# Patient Record
Sex: Male | Born: 1980 | Race: White | Hispanic: No | Marital: Married | Smoking: Never smoker
Health system: Southern US, Community
[De-identification: ages and names within clinical notes are randomized; demographics above are authoritative.]

## PROBLEM LIST (undated history)

## (undated) HISTORY — PX: INGUINAL HERNIA REPAIR: SUR1180

---

## 2014-11-14 ENCOUNTER — Ambulatory Visit (HOSPITAL_COMMUNITY)
Admission: RE | Admit: 2014-11-14 | Discharge: 2014-11-14 | Disposition: A | Discharge status: Home / Self Care | Payer: BLUE CROSS/BLUE SHIELD | Source: Ambulatory Visit | Attending: Family Medicine | Admitting: Family Medicine

## 2014-11-14 ENCOUNTER — Emergency Department (HOSPITAL_COMMUNITY)
Admission: EM | Admit: 2014-11-14 | Discharge: 2014-11-14 | Disposition: A | Discharge status: Home / Self Care | Payer: BLUE CROSS/BLUE SHIELD | Attending: Emergency Medicine | Admitting: Emergency Medicine

## 2014-11-14 ENCOUNTER — Encounter (HOSPITAL_COMMUNITY): Payer: Self-pay

## 2014-11-14 ENCOUNTER — Other Ambulatory Visit (HOSPITAL_COMMUNITY): Payer: Self-pay | Admitting: Family Medicine

## 2014-11-14 DIAGNOSIS — R1031 Right lower quadrant pain: Secondary | ICD-10-CM

## 2014-11-14 DIAGNOSIS — Z79899 Other long term (current) drug therapy: Secondary | ICD-10-CM | POA: Insufficient documentation

## 2014-11-14 DIAGNOSIS — R1011 Right upper quadrant pain: Secondary | ICD-10-CM

## 2014-11-14 DIAGNOSIS — R109 Unspecified abdominal pain: Secondary | ICD-10-CM | POA: Diagnosis present

## 2014-11-14 LAB — CBC WITH DIFFERENTIAL/PLATELET
Basophils Absolute: 0 10*3/uL (ref 0.0–0.1)
Basophils Relative: 0 % (ref 0–1)
Eosinophils Absolute: 0.1 10*3/uL (ref 0.0–0.7)
Eosinophils Relative: 1 % (ref 0–5)
HCT: 40.3 % (ref 39.0–52.0)
Hemoglobin: 14 g/dL (ref 13.0–17.0)
Lymphocytes Relative: 18 % (ref 12–46)
Lymphs Abs: 1.8 10*3/uL (ref 0.7–4.0)
MCH: 30.1 pg (ref 26.0–34.0)
MCHC: 34.7 g/dL (ref 30.0–36.0)
MCV: 86.7 fL (ref 78.0–100.0)
Monocytes Absolute: 0.7 10*3/uL (ref 0.1–1.0)
Monocytes Relative: 7 % (ref 3–12)
Neutro Abs: 7.2 10*3/uL (ref 1.7–7.7)
Neutrophils Relative %: 74 % (ref 43–77)
Platelets: 268 10*3/uL (ref 150–400)
RBC: 4.65 MIL/uL (ref 4.22–5.81)
RDW: 12.1 % (ref 11.5–15.5)
WBC: 9.8 10*3/uL (ref 4.0–10.5)

## 2014-11-14 LAB — BASIC METABOLIC PANEL
Anion gap: 9 (ref 5–15)
BUN: 15 mg/dL (ref 6–23)
CO2: 24 mmol/L (ref 19–32)
Calcium: 9.1 mg/dL (ref 8.4–10.5)
Chloride: 102 mmol/L (ref 96–112)
Creatinine, Ser: 0.97 mg/dL (ref 0.50–1.35)
GFR calc Af Amer: >90 mL/min (ref >90)
GFR calc non Af Amer: >90 mL/min (ref >90)
Glucose, Bld: 94 mg/dL (ref 70–99)
Potassium: 3.5 mmol/L (ref 3.5–5.1)
Sodium: 135 mmol/L (ref 135–145)

## 2014-11-14 MED ORDER — IOHEXOL 300 MG/ML  SOLN
50.0000 mL | Freq: Once | INTRAMUSCULAR | Status: AC | PRN
  Administered 2014-11-14: 50 mL via ORAL

## 2014-11-14 MED ORDER — IOHEXOL 300 MG/ML  SOLN
100.0000 mL | Freq: Once | INTRAMUSCULAR | Status: AC | PRN
  Administered 2014-11-14: 100 mL via INTRAVENOUS

## 2014-11-14 MED ORDER — OXYCODONE-ACETAMINOPHEN 5-325 MG PO TABS: 1.0000 | ORAL_TABLET | Freq: Four times a day (QID) | ORAL | Status: AC | PRN

## 2014-11-14 NOTE — ED Notes (Signed)
Nurse currently drawing labs 

## 2014-11-14 NOTE — ED Provider Notes (Signed)
CSN: [id number]     Arrival date & time 11/14/14  1920 History   First MD Initiated Contact with Patient 11/14/14 1925     Chief Complaint  Patient presents with  . Abdominal Pain     (Consider location/radiation/quality/duration/timing/severity/associated sxs/prior Treatment) HPI  This is a 34 yo male who presents with abdominal pain. Onset of abdominal pain yesterday. Pain initially was periumbilical, constant, and nonradiating. Pain has since moved to the right lower quadrant. Patient reports that in general it is a dull pain area currently rates his pain at 1 out of 10. He states that it is okay if he stays still. Denies any nausea, vomiting, diarrhea, or associated symptoms. Patient was seen by his primary physician and sent for CT scan. CT scan suspicious for early appendicitis. Patient has not had anything to eat today.  History reviewed. No pertinent past medical history. Past Surgical History  Procedure Laterality Date  . Inguinal hernia repair Left    No family history on file. History  Substance Use Topics  . Smoking status: Never Smoker   . Smokeless tobacco: Not on file  . Alcohol Use: No    Review of Systems  Constitutional: Negative.  Negative for fever.  Respiratory: Negative.  Negative for chest tightness and shortness of breath.   Cardiovascular: Negative.  Negative for chest pain.  Gastrointestinal: Positive for abdominal pain. Negative for nausea, vomiting and diarrhea.  Genitourinary: Negative.  Negative for dysuria.  Musculoskeletal: Negative for back pain.  Skin: Negative for rash.  Neurological: Negative for headaches.  All other systems reviewed and are negative.     Allergies  Shellfish allergy  Home Medications   Prior to Admission medications   Medication Sig Start Date End Date Taking? Authorizing Provider  citalopram (CELEXA) 40 MG tablet Take 40 mg by mouth daily. 10/08/14  Yes Historical Provider, MD  ibuprofen (ADVIL,MOTRIN) 200 MG  tablet Take 800 mg by mouth every 6 (six) hours as needed for mild pain or moderate pain.   Yes Historical Provider, MD  oxyCODONE-acetaminophen (PERCOCET/ROXICET) 5-325 MG per tablet Take 1 tablet by mouth every 6 (six) hours as needed for severe pain. 11/14/14   Shon Batonourtney F Jaiven Graveline, MD   BP 150/95 mmHg  Pulse 83  Temp(Src) 98.8 F (37.1 C) (Oral)  Resp 18  SpO2 97% Physical Exam  Constitutional: He is oriented to person, place, and time. He appears well-developed and well-nourished. No distress.  HENT:  Head: Normocephalic and atraumatic.  Cardiovascular: Normal rate, regular rhythm and normal heart sounds.   No murmur heard. Pulmonary/Chest: Effort normal and breath sounds normal. No respiratory distress. He has no wheezes.  Abdominal: Soft. Bowel sounds are normal. There is tenderness. There is no rebound.  Right lower quadrant tenderness to palpation, no rebound or guarding, negative Rovsing's, no signs of peritonitis  Musculoskeletal: He exhibits no edema.  Neurological: He is alert and oriented to person, place, and time.  Skin: Skin is warm and dry.  Psychiatric: He has a normal mood and affect.  Nursing note and vitals reviewed.   ED Course  Procedures (including critical care time) Labs Review Labs Reviewed  CBC WITH DIFFERENTIAL/PLATELET  BASIC METABOLIC PANEL    Imaging Review Ct Abdomen Pelvis W Contrast  11/14/2014   ADDENDUM REPORT: 11/14/2014 19:16  ADDENDUM: We were unable to reach the covering service in a timely manner. The patient was [facility] for further evaluation.   Electronically Signed  By: Carey Bullocks M.D.   On: 11/14/2014 19:16   11/14/2014   CLINICAL DATA:  Right lower quadrant abdominal pain for 1 day. No previous relevant surgery. Initial encounter.  EXAM: CT ABDOMEN AND PELVIS WITH CONTRAST  TECHNIQUE: Multidetector CT imaging of the abdomen and pelvis was performed using the standard protocol  following bolus administration of intravenous contrast.  CONTRAST:  50mL OMNIPAQUE IOHEXOL 300 MG/ML SOLN, OMNIPAQUE IOHEXOL 300 MG/ML SOLN  COMPARISON:  None.  FINDINGS: Lower chest: Clear lung bases. There is no pleural or pericardial effusion. Small hiatal hernia noted.  Hepatobiliary: The liver is normal in density without focal abnormality. No evidence of gallstones, gallbladder wall thickening or biliary dilatation.  Pancreas: Unremarkable. No pancreatic ductal dilatation or surrounding inflammatory changes.  Spleen: Normal in size without focal abnormality.  Adrenals/Urinary Tract: Both adrenal glands appear normal.The kidneys appear normal without evidence of urinary tract calculus, suspicious lesion or hydronephrosis. No bladder abnormalities are seen.  Stomach/Bowel: The appendix is mildly distended to 10 mm. There is minimal surrounding soft tissue stranding suspicious for inflammation. There is no extraluminal fluid collection. The stomach, small bowel and colon appear normal without wall thickening or distention.  Vascular/Lymphatic: There are no enlarged abdominal or pelvic lymph nodes. No significant vascular findings are present.  Reproductive: Unremarkable.  Other: Tiny umbilical hernia containing only fat.  Musculoskeletal: No acute or significant osseous findings.  IMPRESSION: 1. Findings are suspicious for acute appendicitis. No evidence of perforation, abscess or bowel obstruction. 2. No other significant findings. 3. The physician covering for Dr. Tiburcio Pea has been paged at 1835 hours.  Electronically Signed: By: Carey Bullocks M.D. On: 11/14/2014 18:36     EKG Interpretation None      MDM   Final diagnoses:  RLQ abdominal pain    Patient presents with history suggestive of possible appendicitis. CT scan done as an outpatient suggestive of early appendicitis. Patient is comfortable on exam and with minimal pain. No signs of peritonitis.  Discussed with Dr. Michaell Cowing who will  evaluate.  Dr. Michaell Cowing evaluated the patient. Feels patient's exam is very benign and CT findings are very subtle. Patient shows no signs of peritonitis. Dr. Michaell Cowing would like patient to be by mouth challenge. If he tolerates by mouth and does not have any increased pain, he can be discharged home. Patient is okay with this plan. Patient tolerated crackers and fluids.  He had no increase in pain following eating. Patient was given strict return precautions including increasing pain, fever, Nausea, vomiting.  After history, exam, and medical workup I feel the patient has been appropriately medically screened and is safe for discharge home. Pertinent diagnoses were discussed with the patient. Patient was given return precautions.     Shon Baton, MD 11/14/14 2109

## 2014-11-14 NOTE — ED Notes (Signed)
Pt presents in NAD. Pt c/o of stomach pain RLQ at present. CT scan confirms appendicitis.

## 2014-11-14 NOTE — ED Notes (Signed)
Patients PCP (dr. Azucena Kubaeid) on call, called to check in on the patient.

## 2014-11-14 NOTE — Consult Note (Signed)
[facility]  [address]  [place], [place] [address] Phone: [phone] FAX: [phone]     Justin Mcmahon  Nov 22, 1980 [id number]  CARE TEAM:  PCP: Johny Blamer, MD  Outpatient Care Team: Patient Care Team: Johny Blamer, MD as PCP - General (Family Medicine)  Inpatient Treatment Team: Treatment Team: Attending Provider: Shon Baton, MD; Registered Nurse: Federico Flake, RN; Registered Nurse: Herschell Dimes, RN; Consulting Physician: Bishop Limbo, MD  This patient is a 34 y.o.male who presents today for surgical evaluation at the request of Dr Saundra Shelling ED.   Reason for evaluation: Abdominal pain.  Rule out appendicitis.  Pleasant overweight male.  He woke up yesterday morning with sharp upper abdominal pain.  Rather severe.  Right Gas-X.  It did not help.  He does have a history of heartburn and reflux.  He is usually mild and usually controlled with over-the-counter medications.  Sometimes spicy foods bother him which she tends to void.  This pain was not like that.  No problems with greasy foods.  His pain was very sharp and intense.  It concerned him.  He went to his primary care office today.  I actually was feeling better.  Pain seemed to be a little more the right side.  Based on concerns, primary care office ordered CAT scan.  CAT scan done.  Not able to reach primary care office.  There was concern about possible appendicitis.  Sent to the emergency room.  Evaluation made.  While examination underwhelming, surgical consultation requested.  Patient notes the pain is very mild right now.  He is not taken pain medications.  He is not on any antibiotics.  He has not received any other medication.   He had no problems with coughing.  No problem with the ride here.  No nausea or vomiting.  He has not eaten today as instructed.  He is hungry.  No personal nor family history of GI/colon cancer, inflammatory bowel disease,  irritable bowel syndrome, allergy such as Celiac Sprue, dietary/dairy problems, colitis, ulcers nor gastritis.  No recent sick contacts/gastroenteritis.  He has two children at home with mild low-grade fevers but no nausea or vomiting.  No colds or flus.  No travel outside the country.  No changes in diet.  No dysphagia to solids or liquids.  No hematochezia, hematemesis, coffee ground emesis.  No evidence of prior gastric/peptic ulceration.  Not immunosuppressed.  Not diabetic.  He does not smoke.  Not on steroids.  Rather physically active.  No history of cardiac issues.  No recent cold caustic fluids.  No jaundice.  No history of hepatitis or pancreatitis.    History reviewed. No pertinent past medical history.  Past Surgical History  Procedure Laterality Date  . Hernia repair      History   Social History  . Marital Status: Married    Spouse Name: N/A  . Number of Children: N/A  . Years of Education: N/A   Occupational History  . Not on file.   Social History Main Topics  . Smoking status: Never Smoker   . Smokeless tobacco: Not on file  . Alcohol Use: No  . Drug Use: Not on file  . Sexual Activity: Not on file   Other Topics Concern  . Not on file   Social History Narrative    No family history on file.  No current facility-administered medications for this encounter.   No current outpatient prescriptions  on file.     Allergies  Allergen Reactions  . Shellfish Allergy Hives    ROS: Constitutional:  No fevers, chills, sweats.  Weight stable Eyes:  No vision changes, No discharge HENT:  No sore throats, nasal drainage Lymph: No neck swelling, No bruising easily Pulmonary:  No cough, productive sputum CV: No orthopnea, PND  Patient walks 120 minutes for about 3 miles without difficulty.  No exertional chest/neck/shoulder/arm pain. GI:  No personal nor family history of GI/colon cancer, inflammatory bowel disease, irritable bowel syndrome, allergy such as Celiac  Sprue, dietary/dairy problems, colitis, ulcers nor gastritis.  No recent sick contacts/gastroenteritis.  No travel outside the country.  No changes in diet. Renal: No UTIs, No hematuria Genital:  No drainage, bleeding, masses Musculoskeletal: No severe joint pain.  Good ROM major joints Skin:  No sores or lesions.  No rashes Heme/Lymph:  No easy bleeding.  No swollen lymph nodes Neuro: No focal weakness/numbness.  No seizures Psych: No suicidal ideation.  No hallucinations  BP 150/95 mmHg  Pulse 83  Temp(Src) 98.8 F (37.1 C) (Oral)  Resp 18  SpO2 97%  Physical Exam: General: Pt awake/alert/oriented x4 in no major acute distress Eyes: PERRL, normal EOM. Sclera nonicteric Neuro: CN II-XII intact w/o focal sensory/motor deficits. Lymph: No head/neck/groin lymphadenopathy Psych:  No delerium/psychosis/paranoia HENT: Normocephalic, Mucus membranes moist.  No thrush Neck: Supple, No tracheal deviation Chest: No pain.  Good respiratory excursion. CV:  Pulses intact.  Regular rhythm Abdomen: Soft, Nondistended.  Minimally tender RUQ medial to deep palpation.  No pain with cough or bed shake.  No pain with percussion.  No rebound tenderness.  No psoas sign.  Small incarcerated umbilical hernia.  No inflammation. Genital: Normal external male genitalia.  No inguinal hernia. Ext:  SCDs BLE.  No significant edema.  No cyanosis Skin: No petechiae / purpurea.  No major sores Musculoskeletal: No severe joint pain.  Good ROM major joints   Results:   Labs: No results found for this or any previous visit (from the past 48 hour(s)).  Imaging / Studies: Ct Abdomen Pelvis W Contrast  11/14/2014   ADDENDUM REPORT: 11/14/2014 19:16  ADDENDUM: We were unable to reach the covering service in a timely manner. The patient was taken to the emergency department at [facility] for further evaluation.   Electronically Signed   By: Carey Bullocks M.D.   On: 11/14/2014 19:16   11/14/2014    CLINICAL DATA:  Right lower quadrant abdominal pain for 1 day. No previous relevant surgery. Initial encounter.  EXAM: CT ABDOMEN AND PELVIS WITH CONTRAST  TECHNIQUE: Multidetector CT imaging of the abdomen and pelvis was performed using the standard protocol following bolus administration of intravenous contrast.  CONTRAST:  50mL OMNIPAQUE IOHEXOL 300 MG/ML SOLN, OMNIPAQUE IOHEXOL 300 MG/ML SOLN  COMPARISON:  None.  FINDINGS: Lower chest: Clear lung bases. There is no pleural or pericardial effusion. Small hiatal hernia noted.  Hepatobiliary: The liver is normal in density without focal abnormality. No evidence of gallstones, gallbladder wall thickening or biliary dilatation.  Pancreas: Unremarkable. No pancreatic ductal dilatation or surrounding inflammatory changes.  Spleen: Normal in size without focal abnormality.  Adrenals/Urinary Tract: Both adrenal glands appear normal.The kidneys appear normal without evidence of urinary tract calculus, suspicious lesion or hydronephrosis. No bladder abnormalities are seen.  Stomach/Bowel: The appendix is mildly distended to 10 mm. There is minimal surrounding soft tissue stranding suspicious for inflammation. There is no extraluminal fluid collection. The stomach, small  bowel and colon appear normal without wall thickening or distention.  Vascular/Lymphatic: There are no enlarged abdominal or pelvic lymph nodes. No significant vascular findings are present.  Reproductive: Unremarkable.  Other: Tiny umbilical hernia containing only fat.  Musculoskeletal: No acute or significant osseous findings.  IMPRESSION: 1. Findings are suspicious for acute appendicitis. No evidence of perforation, abscess or bowel obstruction. 2. No other significant findings. 3. The physician covering for Dr. Tiburcio PeaHarris has been paged at 1835 hours.  Electronically Signed: By: Carey BullocksWilliam  Veazey M.D. On: 11/14/2014 18:36    Medications / Allergies: per chart  Antibiotics: Anti-infectives    None       Assessment  Justin Mcmahon  34 y.o. male       Problem List:  Active Problems:   Abdominal pain, RUQ (right upper quadrant)   Severe abdominal pain yesterday much milder and right sided.  Time course history and examination make appendicitis unlikely.  Favor mesenteric adenitis  Plan: I am skeptical he truly has appendicitis.  Given his young age he should have a strong inflammatory response at the 36 hour point.  The patient has no signs of peritonitis and is clinically improved in the past 24 hours.  His white count is normal with no left shift at 36 hours from symptom onset.  His CAT scan should be more impressive.  His appendix is retrocecal and the tip does go towards the right upper quadrant which makes me somewhat concerned, but that is the only finding.  Does not seem severely dilated.   He has some mild stranding at best in the right colon mesentery.  Not really at the appendix itself.  I would favor mesenteric adenitis.   I discussed with the patient and the emergency room team, especially Dr Wilkie AyeHorton.  The degree that clinically the patient does not have appendicitis and should at 36 hours from symptom onset.    I would give him a stress test with a solid meal.  If he tolerates that, that is also reassuring and follow-up as needed.  As long as his pain fades away, I favor Dx of a transient viral issue.  I would not give him antibiotics as it could mask early appendicitis.  Let it follow its clinical course naturally as long as he has close follow-up.  If his pain worsens or he begins to have other symptoms concerning for appendicitis such as nausea, vomiting, or anorexia; then, reconsider diagnostic laparoscopy and appendectomy. .  The patient is stable.  There is no evidence of peritonitis, acute abdomen, nor shock.  There is no strong evidence of failure of improvement nor decline with current non-operative management.  There is no need for surgery at the present moment.  We  will be available.  I gave him my card     Ardeth SportsmanSteven C. Murielle Stang, M.D., F.A.C.S. Gastro[facility]ally [facility] [address] [place], [place] [address] [phone] Main / Paging   11/14/2014  Note: Portions of this report may have been transcribed using voice recognition software. Every effort was made to ensure accuracy; however, inadvertent computerized transcription errors may be present.   Any transcriptional errors that result from this process are unintentional.

## 2014-11-14 NOTE — ED Notes (Signed)
Pt able to tolerate liquids prior to dc

## 2014-11-14 NOTE — Discharge Instructions (Signed)
You were seen today for possible appendicitis. While your CT scan so some changes that are consistent with possible early appendicitis, urinary exam is reassuring. You were seen by the general surgeon and felt to not have an acutely inflamed appendix. You were able to tolerate food. If you develop new or worsening symptoms including worsening pain, nausea, vomiting, fever should be reevaluated immediately.  Appendicitis Appendicitis is when the appendix is swollen (inflamed). The inflammation can lead to developing a hole (perforation) and a collection of pus (abscess). CAUSES  There is not always an obvious cause of appendicitis. Sometimes it is caused by an obstruction in the appendix. The obstruction can be caused by:  A small, hard, pea-sized ball of stool (fecalith).  Enlarged lymph glands in the appendix. SYMPTOMS   Pain around your belly button (navel) that moves toward your lower right belly (abdomen). The pain can become more severe and sharp as time passes.  Tenderness in the lower right abdomen. Pain gets worse if you cough or make a sudden movement.  Feeling sick to your stomach (nauseous).  Throwing up (vomiting).  Loss of appetite.  Fever.  Constipation.  Diarrhea.  Generally not feeling well. DIAGNOSIS   Physical exam.  Blood tests.  Urine test.  X-rays or a CT scan may confirm the diagnosis. TREATMENT  Once the diagnosis of appendicitis is made, the most common treatment is to remove the appendix as soon as possible. This procedure is called appendectomy. In an open appendectomy, a cut (incision) is made in the lower right abdomen and the appendix is removed. In a laparoscopic appendectomy, usually 3 small incisions are made. Long, thin instruments and a camera tube are used to remove the appendix. Most patients go home in 24 to 48 hours after appendectomy. In some situations, the appendix may have already perforated and an abscess may have formed. The abscess  may have a "wall" around it as seen on a CT scan. In this case, a drain may be placed into the abscess to remove fluid, and you may be treated with antibiotic medicines that kill germs. The medicine is given through a tube in your vein (IV). Once the abscess has resolved, it may or may not be necessary to have an appendectomy. You may need to stay in the hospital longer than 48 hours. Document Released: 07/05/2005 Document Revised: 01/04/2012 Document Reviewed: 09/30/2009 [facility] Patient Information 2015 [employer] [place]. This information is not intended to replace advice given to you by your health care provider. Make sure you discuss any questions you have with your health care provider.

## 2016-01-23 IMAGING — CT CT ABD-PELV W/ CM
2 of 4 series · 16 of 46 positions shown, 18 images · IV contrast (omnipaque)
Comparison: None.

ADDENDUM:
We were unable to reach the covering service in a timely manner. The
patient was taken to the [HOSPITAL] [REDACTED] for further evaluation.
CLINICAL DATA: Right lower quadrant abdominal pain for 1 day. No
previous relevant surgery. Initial encounter.

EXAM:
CT ABDOMEN AND PELVIS WITH CONTRAST
TECHNIQUE: Multidetector CT imaging of the abdomen and pelvis was performed
using the standard protocol following bolus administration of
intravenous contrast.
CONTRAST:  50mL OMNIPAQUE IOHEXOL 300 MG/ML SOLN, 100mL OMNIPAQUE
IOHEXOL 300 MG/ML SOLN

[Series 2: rtn a/p with · axial · 0.78mm/px · z∈[-519,-99]mm · 13 of 94 slices shown, 15 images]
[im 5/94  soft-tissue]
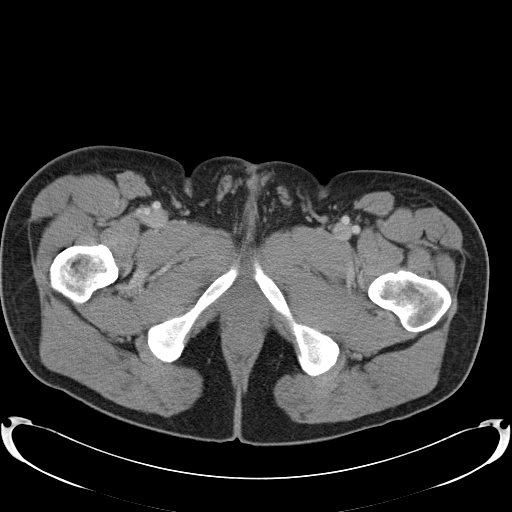
[im 5/94  bone]
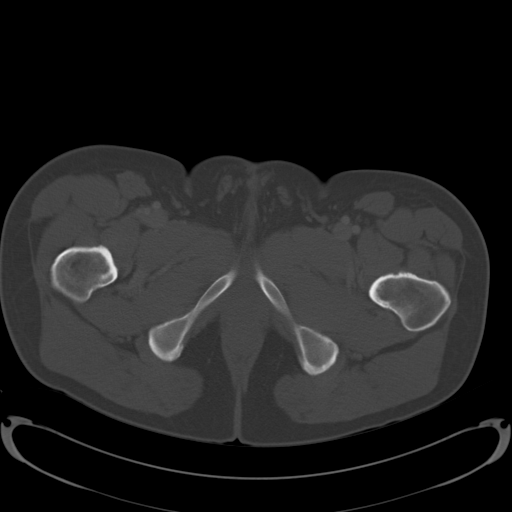
[im 13/94  soft-tissue]
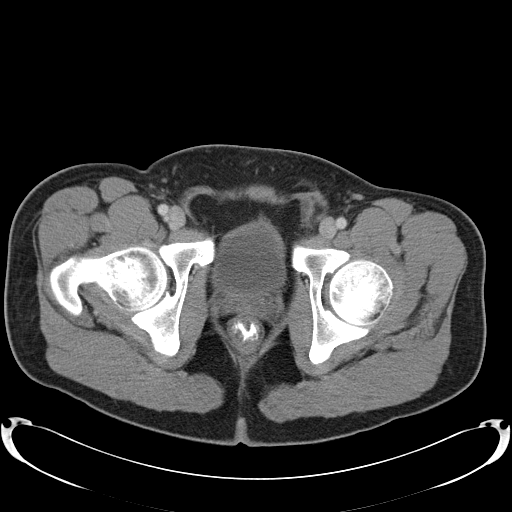
[im 21/94  soft-tissue]
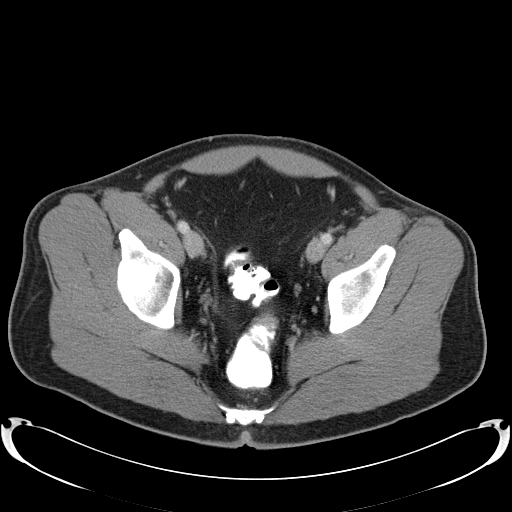
[im 25/94  soft-tissue]
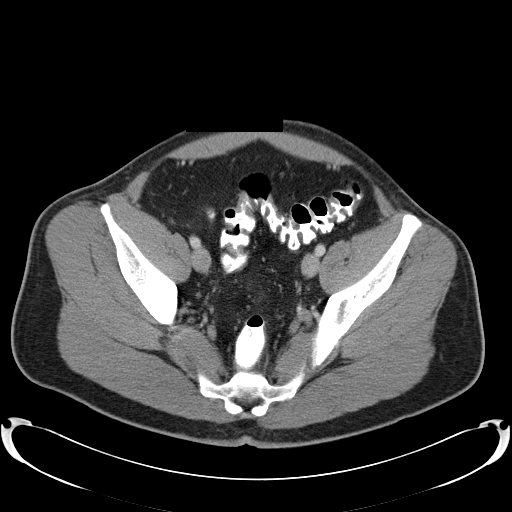
[im 33/94  soft-tissue]
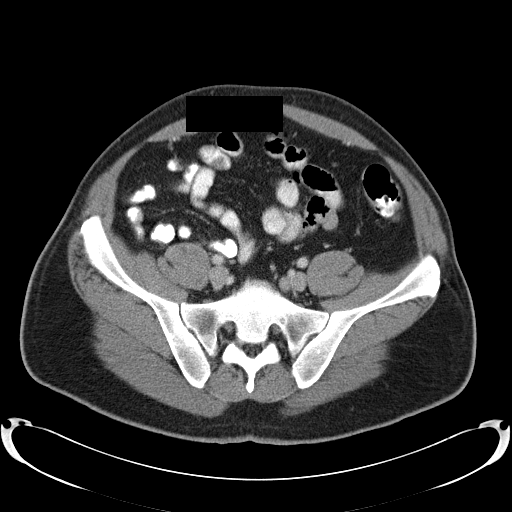
[im 41/94  soft-tissue]
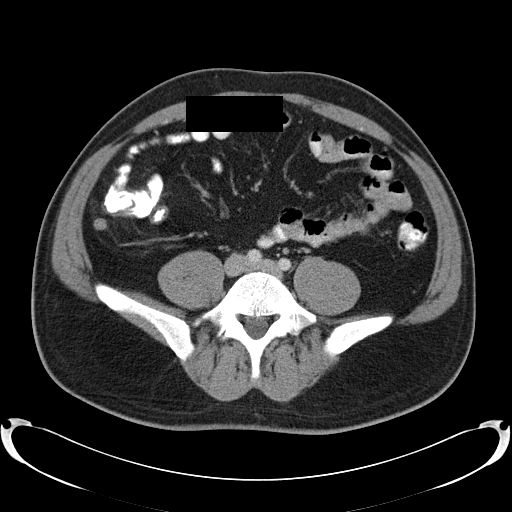
[im 49/94  soft-tissue]
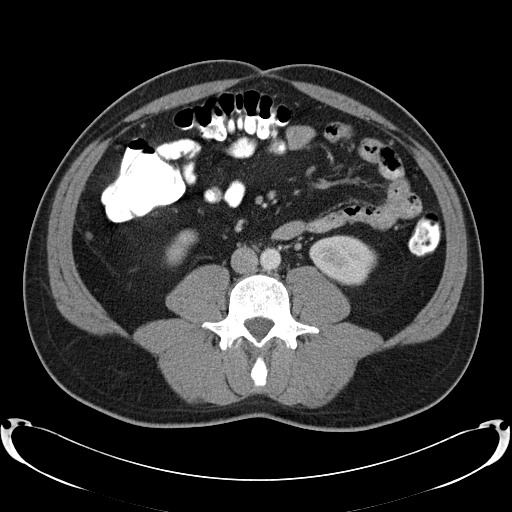
[im 53/94  soft-tissue]
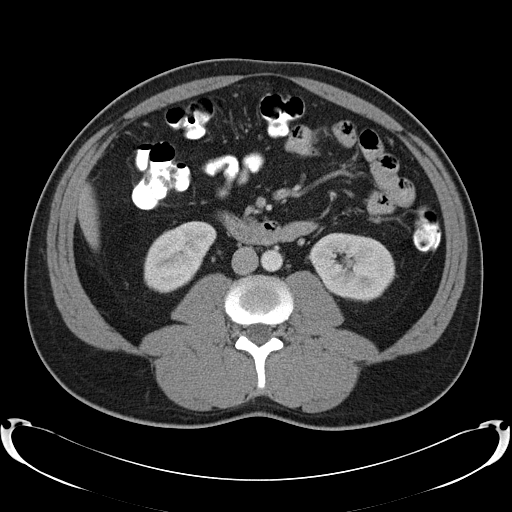
[im 61/94  soft-tissue]
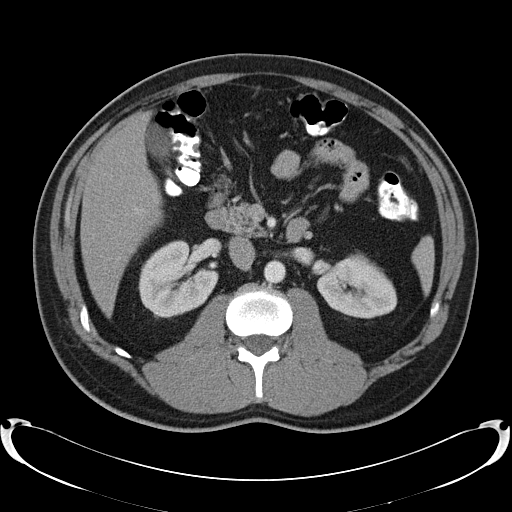
[im 61/94  bone]
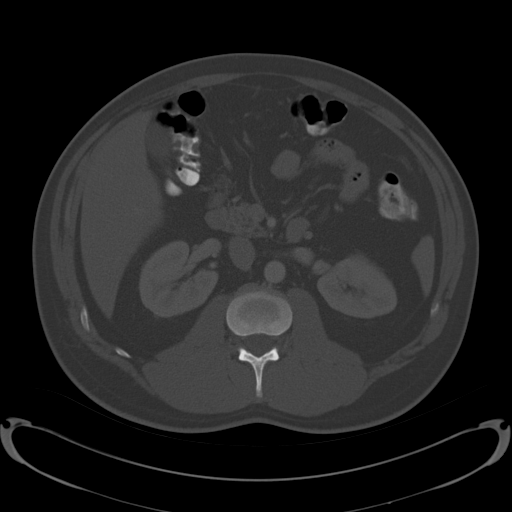
[im 69/94  soft-tissue]
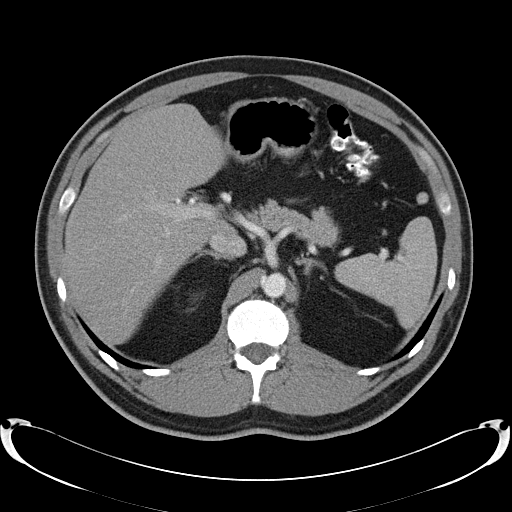
[im 73/94  soft-tissue]
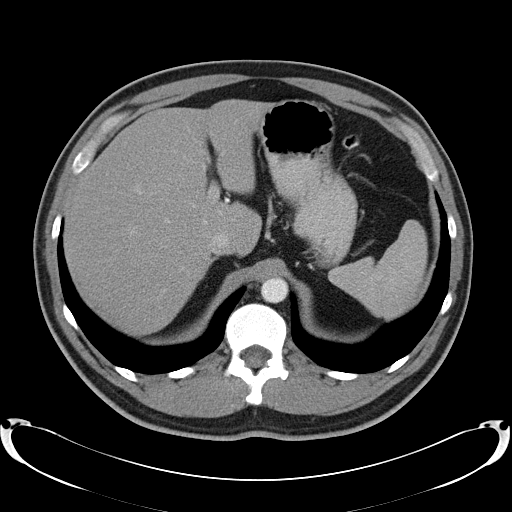
[im 81/94  soft-tissue]
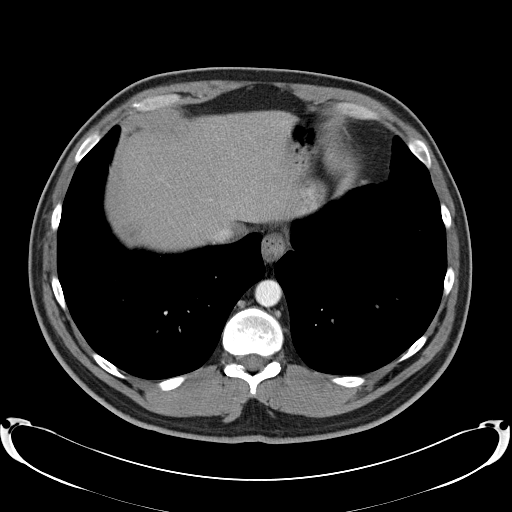
[im 89/94  soft-tissue]
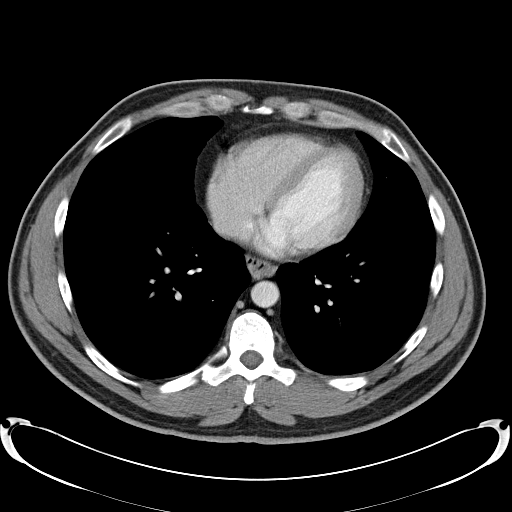

[Series 602: coronals · coronal · 0.91mm/px · 3 of 141 slices shown]
[im 47/141  soft-tissue]
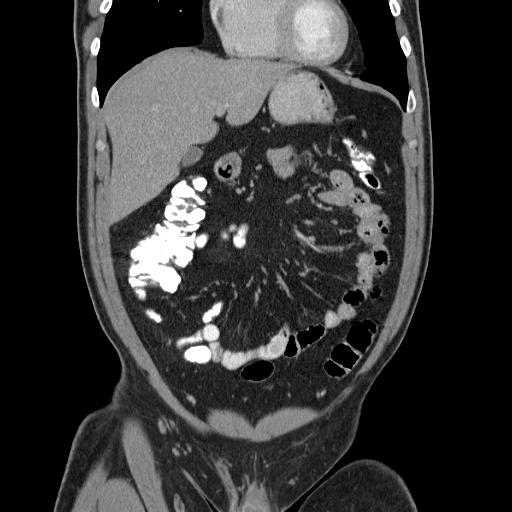
[im 63/141  soft-tissue]
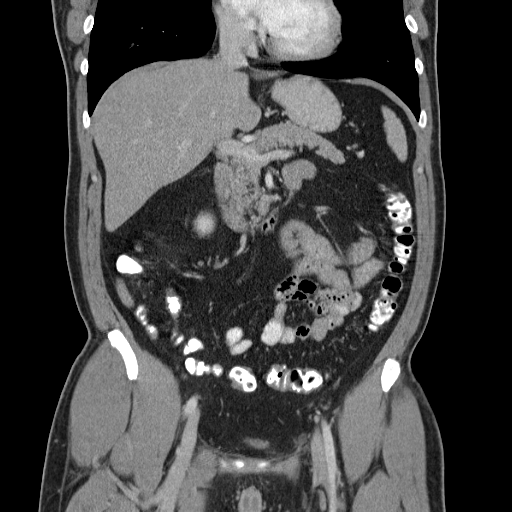
[im 78/141  soft-tissue]
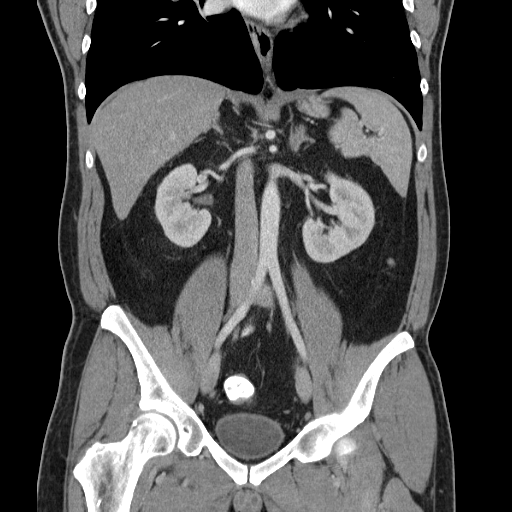

[16 of 46 positions shown; findings below may reference images not displayed]

FINDINGS: Lower chest: Clear lung bases. There is no pleural or pericardial
effusion. Small hiatal hernia noted.

Hepatobiliary: The liver is normal in density without focal
abnormality. No evidence of gallstones, gallbladder wall thickening
or biliary dilatation.

Pancreas: Unremarkable. No pancreatic ductal dilatation or
surrounding inflammatory changes.

Spleen: Normal in size without focal abnormality.

Adrenals/Urinary Tract: Both adrenal glands appear normal.The
kidneys appear normal without evidence of urinary tract calculus,
suspicious lesion or hydronephrosis. No bladder abnormalities are
seen.

Stomach/Bowel: The appendix is mildly distended to 10 mm. There is
minimal surrounding soft tissue stranding suspicious for
inflammation. There is no extraluminal fluid collection. The
stomach, small bowel and colon appear normal without wall thickening
or distention.

Vascular/Lymphatic: There are no enlarged abdominal or pelvic lymph
nodes. No significant vascular findings are present.

Reproductive: Unremarkable.

Other: Tiny umbilical hernia containing only fat.

Musculoskeletal: No acute or significant osseous findings.
IMPRESSION: 1. Findings are suspicious for acute appendicitis. No evidence of
perforation, abscess or bowel obstruction.
2. No other significant findings.
3. The physician covering for Dr. Dregg has been paged at 1098
hours.

## 2016-05-25 DIAGNOSIS — F411 Generalized anxiety disorder: Secondary | ICD-10-CM | POA: Diagnosis not present

## 2016-05-25 DIAGNOSIS — R030 Elevated blood-pressure reading, without diagnosis of hypertension: Secondary | ICD-10-CM | POA: Diagnosis not present

## 2016-05-25 DIAGNOSIS — Z23 Encounter for immunization: Secondary | ICD-10-CM | POA: Diagnosis not present

## 2017-07-22 DIAGNOSIS — F411 Generalized anxiety disorder: Secondary | ICD-10-CM | POA: Diagnosis not present

## 2017-07-22 DIAGNOSIS — I10 Essential (primary) hypertension: Secondary | ICD-10-CM | POA: Diagnosis not present

## 2017-07-22 DIAGNOSIS — F324 Major depressive disorder, single episode, in partial remission: Secondary | ICD-10-CM | POA: Diagnosis not present

## 2018-07-24 DIAGNOSIS — I10 Essential (primary) hypertension: Secondary | ICD-10-CM | POA: Diagnosis not present

## 2018-07-24 DIAGNOSIS — F324 Major depressive disorder, single episode, in partial remission: Secondary | ICD-10-CM | POA: Diagnosis not present

## 2019-01-24 DIAGNOSIS — Z209 Contact with and (suspected) exposure to unspecified communicable disease: Secondary | ICD-10-CM | POA: Diagnosis not present

## 2019-04-25 DIAGNOSIS — Z03818 Encounter for observation for suspected exposure to other biological agents ruled out: Secondary | ICD-10-CM | POA: Diagnosis not present

## 2019-04-25 DIAGNOSIS — Z20828 Contact with and (suspected) exposure to other viral communicable diseases: Secondary | ICD-10-CM | POA: Diagnosis not present

## 2019-04-30 DIAGNOSIS — U071 COVID-19: Secondary | ICD-10-CM | POA: Diagnosis not present

## 2019-04-30 DIAGNOSIS — R05 Cough: Secondary | ICD-10-CM | POA: Diagnosis not present

## 2019-04-30 DIAGNOSIS — J029 Acute pharyngitis, unspecified: Secondary | ICD-10-CM | POA: Diagnosis not present

## 2019-04-30 DIAGNOSIS — R519 Headache, unspecified: Secondary | ICD-10-CM | POA: Diagnosis not present

## 2019-07-25 DIAGNOSIS — F411 Generalized anxiety disorder: Secondary | ICD-10-CM | POA: Diagnosis not present

## 2019-07-25 DIAGNOSIS — E7800 Pure hypercholesterolemia, unspecified: Secondary | ICD-10-CM | POA: Diagnosis not present

## 2019-07-25 DIAGNOSIS — I10 Essential (primary) hypertension: Secondary | ICD-10-CM | POA: Diagnosis not present

## 2019-07-25 DIAGNOSIS — Z125 Encounter for screening for malignant neoplasm of prostate: Secondary | ICD-10-CM | POA: Diagnosis not present

## 2019-07-25 DIAGNOSIS — F324 Major depressive disorder, single episode, in partial remission: Secondary | ICD-10-CM | POA: Diagnosis not present

## 2020-02-15 DIAGNOSIS — Z20822 Contact with and (suspected) exposure to covid-19: Secondary | ICD-10-CM | POA: Diagnosis not present

## 2020-03-03 ENCOUNTER — Other Ambulatory Visit: Payer: Self-pay | Admitting: Family Medicine

## 2020-03-03 ENCOUNTER — Ambulatory Visit
Admission: RE | Admit: 2020-03-03 | Discharge: 2020-03-03 | Disposition: A | Discharge status: Home / Self Care | Payer: BC Managed Care – PPO | Source: Ambulatory Visit | Attending: Family Medicine | Admitting: Family Medicine

## 2020-03-03 ENCOUNTER — Other Ambulatory Visit: Payer: Self-pay

## 2020-03-03 DIAGNOSIS — M79672 Pain in left foot: Secondary | ICD-10-CM

## 2021-05-12 IMAGING — CR DG FOOT 2V*L*
2 series · 2 of 2 positions shown · non-contrast
Comparison: None.

CLINICAL DATA: Left foot pain. Lateral pain for 8 months, worsening
over the last week. No known injury.

EXAM:
LEFT FOOT - 2 VIEW

[x foot ap left]
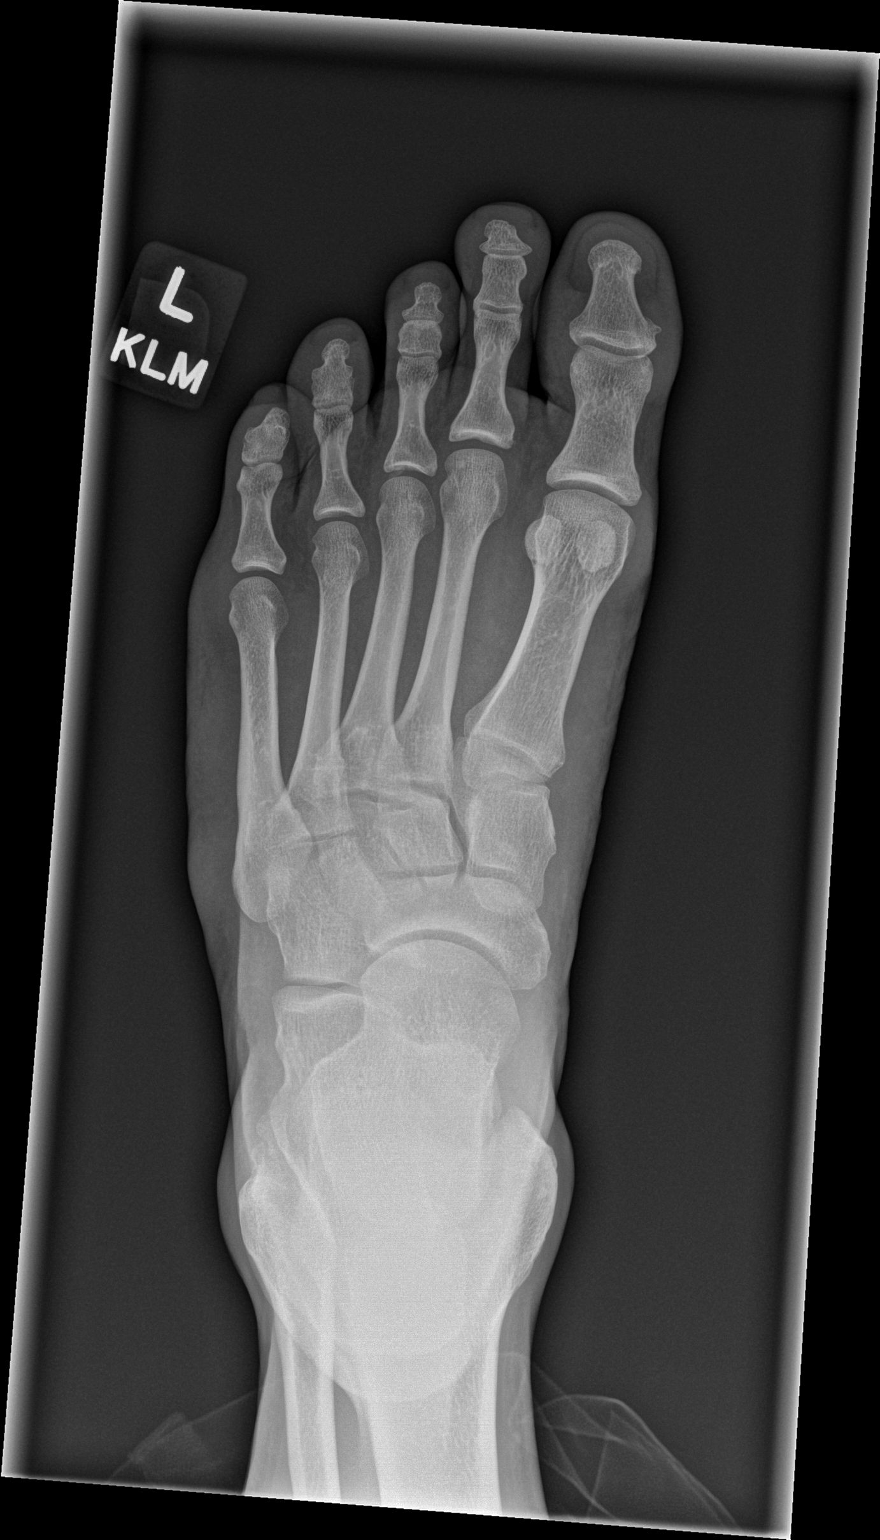

[x foot lat left]
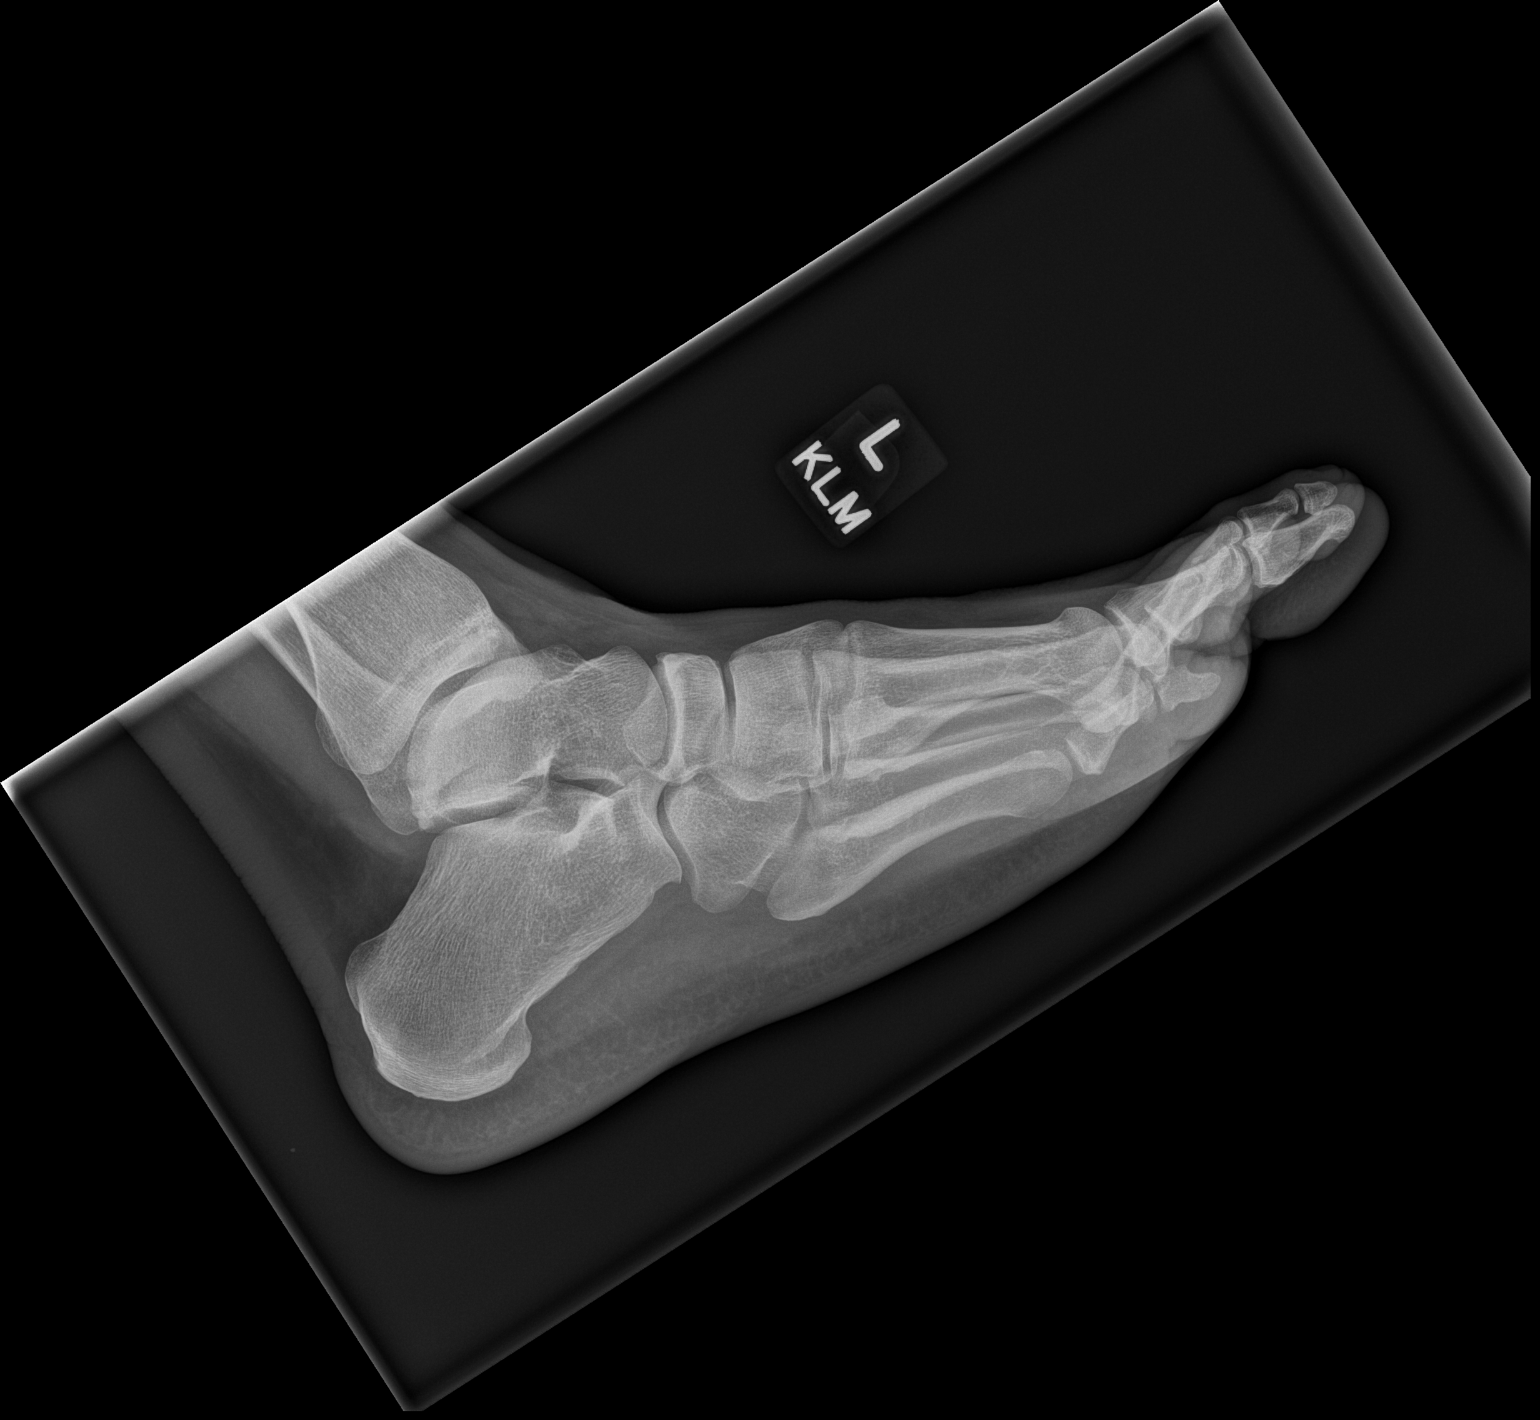

[2 of 2 positions shown; findings below may reference images not displayed]

FINDINGS: There is no evidence of fracture or dislocation. Normal alignment
and joint spaces. There is no evidence of arthropathy or other focal
bone abnormality. Soft tissues are unremarkable.
IMPRESSION: Negative radiographs of the left foot.
